# Patient Record
Sex: Male | Born: 1990 | Race: White | Hispanic: No | Marital: Single | Smoking: Current every day smoker
Health system: Southern US, Community
[De-identification: ages and names within clinical notes are randomized; demographics above are authoritative.]

---
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
## 2018-10-07 ENCOUNTER — Emergency Department (HOSPITAL_COMMUNITY)
Admission: EM | Admit: 2018-10-07 | Discharge: 2018-10-08 | Disposition: A | Discharge status: Home / Self Care | Payer: BC Managed Care – PPO | Attending: Emergency Medicine | Admitting: Emergency Medicine

## 2018-10-07 DIAGNOSIS — W109XXA Fall (on) (from) unspecified stairs and steps, initial encounter: Secondary | ICD-10-CM | POA: Insufficient documentation

## 2018-10-07 DIAGNOSIS — W25XXXA Contact with sharp glass, initial encounter: Secondary | ICD-10-CM | POA: Diagnosis not present

## 2018-10-07 DIAGNOSIS — Y929 Unspecified place or not applicable: Secondary | ICD-10-CM | POA: Diagnosis not present

## 2018-10-07 DIAGNOSIS — S59902A Unspecified injury of left elbow, initial encounter: Secondary | ICD-10-CM | POA: Diagnosis present

## 2018-10-07 DIAGNOSIS — F17210 Nicotine dependence, cigarettes, uncomplicated: Secondary | ICD-10-CM | POA: Diagnosis not present

## 2018-10-07 DIAGNOSIS — Y939 Activity, unspecified: Secondary | ICD-10-CM | POA: Diagnosis not present

## 2018-10-07 DIAGNOSIS — Y999 Unspecified external cause status: Secondary | ICD-10-CM | POA: Insufficient documentation

## 2018-10-07 DIAGNOSIS — S0990XA Unspecified injury of head, initial encounter: Secondary | ICD-10-CM | POA: Insufficient documentation

## 2018-10-07 DIAGNOSIS — Z1881 Retained glass fragments: Secondary | ICD-10-CM | POA: Insufficient documentation

## 2018-10-07 DIAGNOSIS — S51022A Laceration with foreign body of left elbow, initial encounter: Secondary | ICD-10-CM | POA: Insufficient documentation

## 2018-10-07 DIAGNOSIS — S51019A Laceration without foreign body of unspecified elbow, initial encounter: Secondary | ICD-10-CM

## 2018-10-07 DIAGNOSIS — W19XXXA Unspecified fall, initial encounter: Secondary | ICD-10-CM

## 2018-10-07 NOTE — ED Triage Notes (Signed)
Pt states he was climbing up stairs and his shoe got caught on the step and he tripped and fell into a glass door; pt has a large open laceration to his left forearm; glass can be seen in the wound; pt has a few lacerations to his face and right ear

## 2018-10-08 ENCOUNTER — Emergency Department (HOSPITAL_COMMUNITY): Payer: BC Managed Care – PPO

## 2018-10-08 ENCOUNTER — Other Ambulatory Visit: Payer: Self-pay

## 2018-10-08 ENCOUNTER — Encounter (HOSPITAL_COMMUNITY): Payer: Self-pay | Admitting: *Deleted

## 2018-10-08 MED ORDER — SULFAMETHOXAZOLE-TRIMETHOPRIM 800-160 MG PO TABS: 1.0000 | ORAL_TABLET | Freq: Two times a day (BID) | ORAL | 0 refills | Status: AC

## 2018-10-08 MED ORDER — LIDOCAINE-EPINEPHRINE (PF) 2 %-1:200000 IJ SOLN
20.0000 mL | Freq: Once | INTRAMUSCULAR | Status: AC
  Administered 2018-10-08: 20 mL
  Filled 2018-10-08: qty 20

## 2018-10-08 MED ORDER — POVIDONE-IODINE 10 % EX SOLN
CUTANEOUS | Status: DC | PRN
  Administered 2018-10-08: 01:00:00 via TOPICAL
  Filled 2018-10-08: qty 60

## 2018-10-08 MED ORDER — AMOXICILLIN-POT CLAVULANATE 875-125 MG PO TABS: 1.0000 | ORAL_TABLET | Freq: Two times a day (BID) | ORAL | 0 refills | Status: DC

## 2018-10-08 MED ORDER — AMOXICILLIN-POT CLAVULANATE 875-125 MG PO TABS
1.0000 | ORAL_TABLET | Freq: Once | ORAL | Status: AC
  Administered 2018-10-08: 02:00:00 1 via ORAL
  Filled 2018-10-08: qty 1

## 2018-10-08 MED ORDER — SULFAMETHOXAZOLE-TRIMETHOPRIM 800-160 MG PO TABS
1.0000 | ORAL_TABLET | Freq: Once | ORAL | Status: AC
  Administered 2018-10-08: 1 via ORAL
  Filled 2018-10-08: qty 1

## 2018-10-08 MED ORDER — IBUPROFEN 600 MG PO TABS: 600.0000 mg | ORAL_TABLET | Freq: Four times a day (QID) | ORAL | 0 refills | Status: AC | PRN

## 2018-10-08 MED ORDER — BACITRACIN ZINC 500 UNIT/GM EX OINT
TOPICAL_OINTMENT | Freq: Once | CUTANEOUS | Status: AC
  Administered 2018-10-08: 2 via TOPICAL
  Filled 2018-10-08: qty 1.8

## 2018-10-08 NOTE — ED Notes (Signed)
Pt back from x-ray.

## 2018-10-08 NOTE — Discharge Instructions (Addendum)
As we discussed there are small glass fragments remaining in your wound and there is some gas in your elbow joint which suggests there is damage to the joint itself.  It is very important that you follow-up with the hand surgeon Dr. Amedeo Plenty and take the antibiotics as prescribed.  You should wash your mouth with salt water rinses.  Keep the wound clean and dry for 24 hours then you may start to wash it gently but do not scrub it or soak it.  Return to the ED with worsening pain, fever, redness, numbness, weakness in the arm or any other concerns.
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
## 2019-06-15 ENCOUNTER — Encounter: Payer: Self-pay | Admitting: Emergency Medicine

## 2019-06-15 ENCOUNTER — Ambulatory Visit
Admission: EM | Admit: 2019-06-15 | Discharge: 2019-06-15 | Disposition: A | Discharge status: Home / Self Care | Payer: BC Managed Care – PPO | Attending: Emergency Medicine | Admitting: Emergency Medicine

## 2019-06-15 ENCOUNTER — Other Ambulatory Visit: Payer: Self-pay

## 2019-06-15 DIAGNOSIS — S41112A Laceration without foreign body of left upper arm, initial encounter: Secondary | ICD-10-CM

## 2019-06-15 MED ORDER — CEPHALEXIN 500 MG PO CAPS: 500.0000 mg | ORAL_CAPSULE | Freq: Two times a day (BID) | ORAL | 0 refills | Status: AC
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
